# Patient Record
Sex: Male | Born: 1985 | Race: White | Hispanic: No | Marital: Married | Smoking: Former smoker
Health system: Southern US, Community
[De-identification: ages and names within clinical notes are randomized; demographics above are authoritative.]

---
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
## 2018-04-15 ENCOUNTER — Encounter (HOSPITAL_BASED_OUTPATIENT_CLINIC_OR_DEPARTMENT_OTHER): Payer: Self-pay | Admitting: *Deleted

## 2018-04-15 ENCOUNTER — Emergency Department (HOSPITAL_BASED_OUTPATIENT_CLINIC_OR_DEPARTMENT_OTHER): Payer: Worker's Compensation

## 2018-04-15 ENCOUNTER — Emergency Department (HOSPITAL_BASED_OUTPATIENT_CLINIC_OR_DEPARTMENT_OTHER)
Admission: EM | Admit: 2018-04-15 | Discharge: 2018-04-15 | Disposition: A | Discharge status: Home / Self Care | Payer: Worker's Compensation | Attending: Emergency Medicine | Admitting: Emergency Medicine

## 2018-04-15 ENCOUNTER — Other Ambulatory Visit: Payer: Self-pay

## 2018-04-15 DIAGNOSIS — Y9301 Activity, walking, marching and hiking: Secondary | ICD-10-CM | POA: Diagnosis not present

## 2018-04-15 DIAGNOSIS — X509XXA Other and unspecified overexertion or strenuous movements or postures, initial encounter: Secondary | ICD-10-CM | POA: Insufficient documentation

## 2018-04-15 DIAGNOSIS — Y998 Other external cause status: Secondary | ICD-10-CM | POA: Insufficient documentation

## 2018-04-15 DIAGNOSIS — Y9289 Other specified places as the place of occurrence of the external cause: Secondary | ICD-10-CM | POA: Diagnosis not present

## 2018-04-15 DIAGNOSIS — Z87891 Personal history of nicotine dependence: Secondary | ICD-10-CM | POA: Diagnosis not present

## 2018-04-15 DIAGNOSIS — S93401A Sprain of unspecified ligament of right ankle, initial encounter: Secondary | ICD-10-CM | POA: Diagnosis not present

## 2018-04-15 DIAGNOSIS — S99911A Unspecified injury of right ankle, initial encounter: Secondary | ICD-10-CM | POA: Diagnosis present

## 2018-04-15 MED ORDER — OXYCODONE HCL 5 MG PO TABS
5.0000 mg | ORAL_TABLET | Freq: Once | ORAL | Status: AC
  Administered 2018-04-15: 5 mg via ORAL
  Filled 2018-04-15: qty 1

## 2018-04-15 MED ORDER — NAPROXEN 250 MG PO TABS
500.0000 mg | ORAL_TABLET | Freq: Once | ORAL | Status: AC
  Administered 2018-04-15: 500 mg via ORAL
  Filled 2018-04-15: qty 2

## 2018-04-15 NOTE — ED Notes (Signed)
ED Provider at bedside. 

## 2018-04-15 NOTE — Discharge Instructions (Signed)
You were evaluated in the Emergency Department and after careful evaluation, we did not find any emergent condition requiring admission or further testing in the hospital.  Your symptoms today seem to be due to an ankle sprain.  Please use Tylenol and ibuprofen at home for pain, rest the ankle for the next 2 days with slow return to normal daily activities.  Please return to the Emergency Department if you experience any worsening of your condition.  We encourage you to follow up with a primary care provider.  Thank you for allowing us to be a part of your care.

## 2018-04-15 NOTE — ED Provider Notes (Signed)
[facility] Emergency Department Provider Note MRN:  [id number]  Arrival date & time: 04/15/18     Chief Complaint   Ankle Pain   History of Present Illness   Jack Garrett is a 32 y.o. year-old male with no pertinent past medical history presenting to the ED with chief complaint of ankle pain.  Shortly prior to arrival, patient was stepping off of a curb and landed awkwardly on his right ankle.  Reports a forced inversion of the right ankle, followed by immediate moderate to severe pain.  The pain is described as sharp, constant, worse with motion.  Denies other trauma.  Review of Systems  A problem-focused ROS was performed. Positive for ankle pain.  Patient denies other trauma.  Patient's Health History   History reviewed. No pertinent past medical history.  History reviewed. No pertinent surgical history.  History reviewed. No pertinent family history.  Social History   Socioeconomic History  . Marital status: Married    Spouse name: Not on file  . Number of children: Not on file  . Years of education: Not on file  . Highest education level: Not on file  Occupational History  . Not on file  Social Needs  . Financial resource strain: Not on file  . Food insecurity:    Worry: Not on file    Inability: Not on file  . Transportation needs:    Medical: Not on file    Non-medical: Not on file  Tobacco Use  . Smoking status: Former Games developermoker  . Smokeless tobacco: Never Used  Substance and Sexual Activity  . Alcohol use: Not on file  . Drug use: Not on file  . Sexual activity: Not on file  Lifestyle  . Physical activity:    Days per week: Not on file    Minutes per session: Not on file  . Stress: Not on file  Relationships  . Social connections:    Talks on phone: Not on file    Gets together: Not on file    Attends religious service: Not on file    Active member of club or organization: Not on file    Attends meetings of clubs or  organizations: Not on file    Relationship status: Not on file  . Intimate partner violence:    Fear of current or ex partner: Not on file    Emotionally abused: Not on file    Physically abused: Not on file    Forced sexual activity: Not on file  Other Topics Concern  . Not on file  Social History Narrative  . Not on file     Physical Exam  Vital Signs and Nursing Notes reviewed Vitals:   04/15/18 0801  BP: (!) 149/95  Pulse: 64  Resp: 18  Temp: 97.7 F (36.5 C)  SpO2: 100%    CONSTITUTIONAL: Well-appearing, NAD NEURO:  Alert and oriented x 3, no focal deficits EYES:  eyes equal and reactive ENT/NECK:  no LAD, no JVD CARDIO: Regular rate, well-perfused, normal S1 and S2 PULM:  CTAB no wheezing or rhonchi GI/GU:  normal bowel sounds, non-distended, non-tender MSK/SPINE: Swelling and bruising to the right lateral malleolus and right lateral foot SKIN:  no rash PSYCH:  Appropriate speech and behavior  Diagnostic and Interventional Summary    EKG Interpretation  Date/Time:    Ventricular Rate:    PR Interval:    QRS Duration:   QT Interval:    QTC Calculation:  R Axis:     Text Interpretation:        Labs Reviewed - No data to display  DG Ankle Complete Right  Final Result    DG Foot Complete Right  Final Result      Medications  oxyCODONE (Oxy IR/ROXICODONE) immediate release tablet 5 mg (5 mg Oral Given 04/15/18 0810)  naproxen (NAPROSYN) tablet 500 mg (500 mg Oral Given 04/15/18 0810)     Procedures Critical Care  ED Course and Medical Decision Making  I have reviewed the triage vital signs and the nursing notes.  Pertinent labs & imaging results that were available during my care of the patient were reviewed by me and considered in my medical decision making (see below for details).    32 year old male otherwise healthy here with forced inversion of the right ankle stepping off a curb.  Sprain versus fracture, no significant impact to the plantar  surface, neurovascularly intact.  X-rays pending.  X-rays reveal no fracture.  Patient will use crutches for comfort, R ICE for the next 48 hours, advised to follow-up with primary care provider if not significantly improved in 2 weeks.  After the discussed management above, the patient was determined to be safe for discharge.  The patient was in agreement with this plan and all questions regarding their care were answered.  ED return precautions were discussed and the patient will return to the ED with any significant worsening of condition.  Elmer SowMichael M. Pilar PlateBero, MD [facility] [email or web address]  Final Clinical Impressions(s) / ED Diagnoses     ICD-10-CM   1. Sprain of right ankle, unspecified ligament, initial encounter S93.401A     ED Discharge Orders    None         Sabas SousBero, Dsean Vantol M, MD 04/15/18 [phone]

## 2018-04-15 NOTE — ED Notes (Signed)
Lrg ice bag given to Pt for ankle.

## 2018-04-15 NOTE — ED Triage Notes (Signed)
Pt amb to room 2 using crutches. Reports slip and fall yesterday off a curb, turning his right ankle. Denies head injury or loc, denies any other injury or c/o.

## 2019-10-04 IMAGING — CR DG FOOT COMPLETE 3+V*R*
3 series · 3 of 3 positions shown · non-contrast
Comparison: None.

CLINICAL DATA: Pain following rolling type injury

EXAM:
RIGHT FOOT COMPLETE - 3+ VIEW

[t foot lat right]
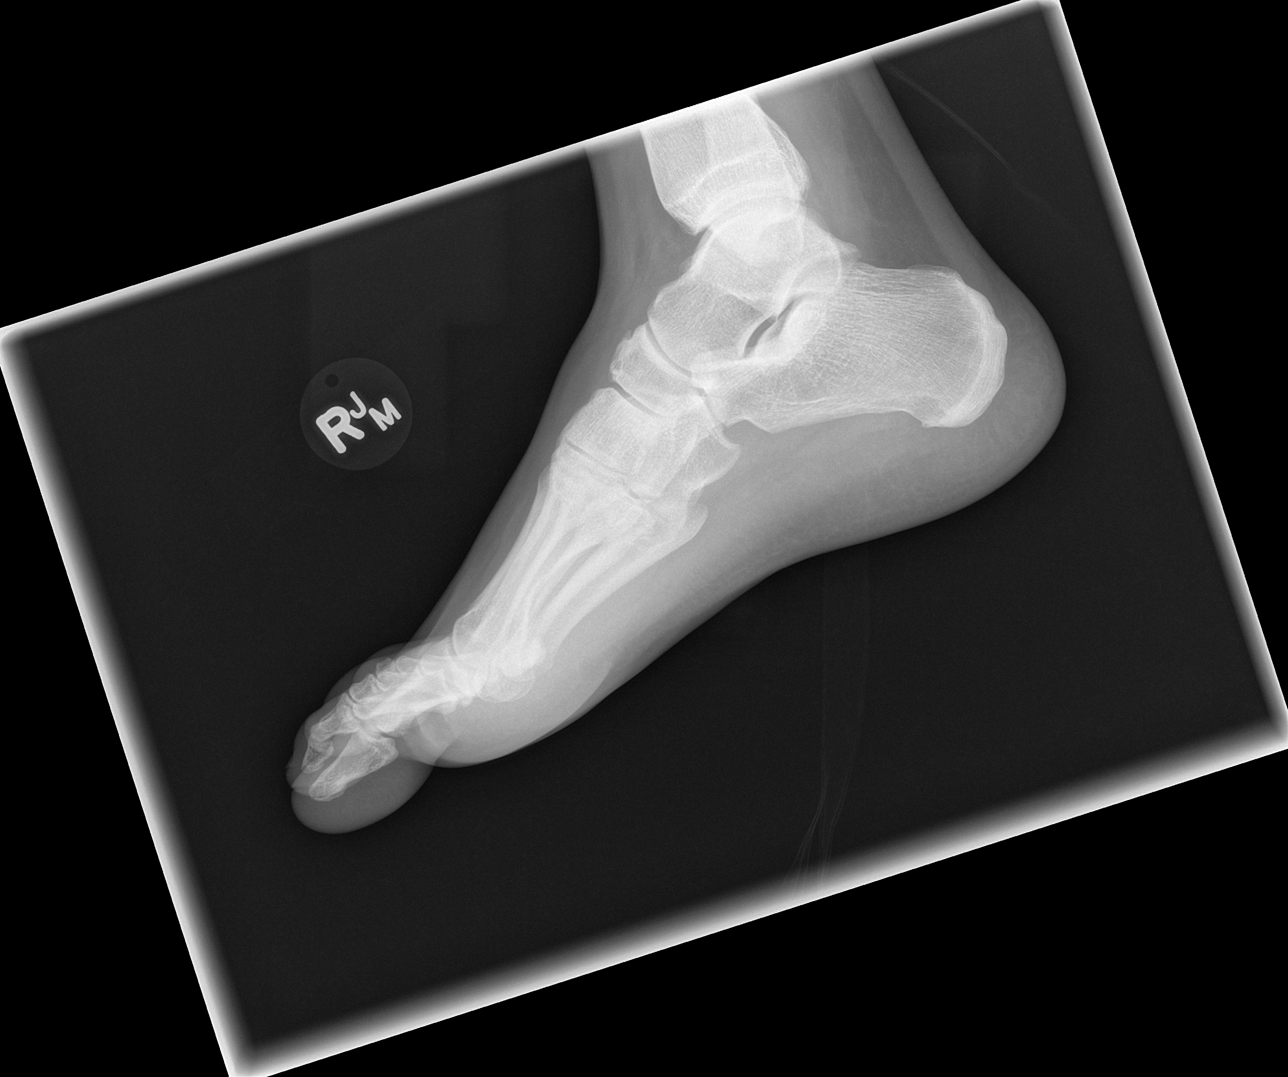

[t foot ap right]
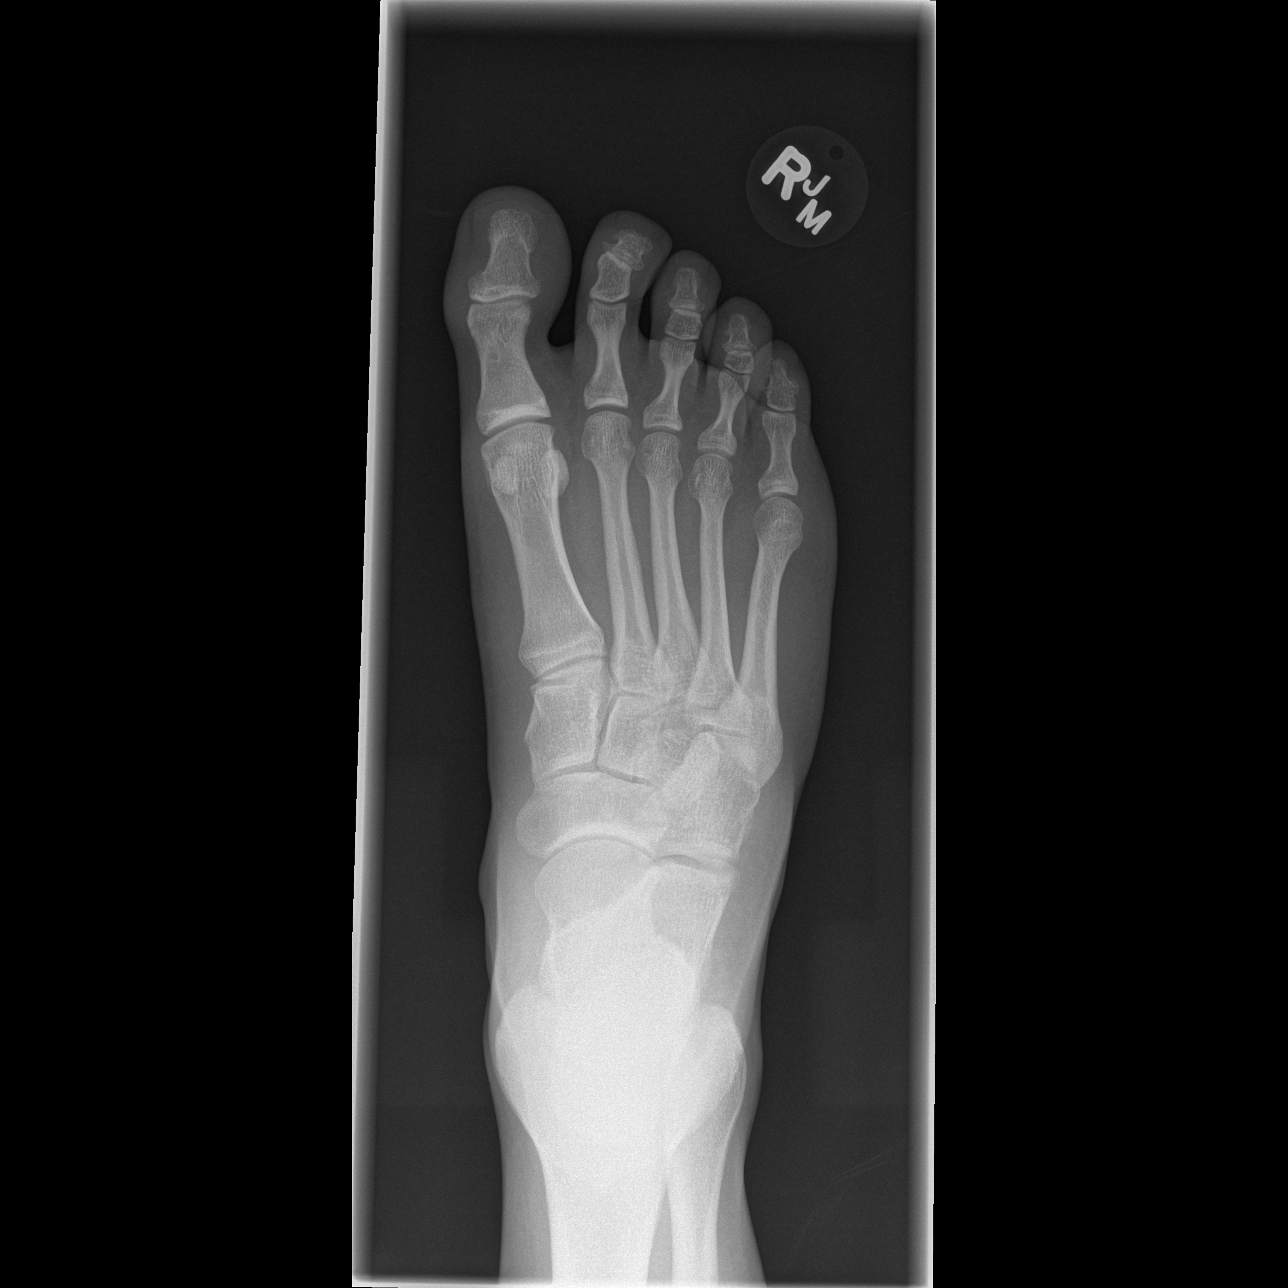

[t foot oblique right]
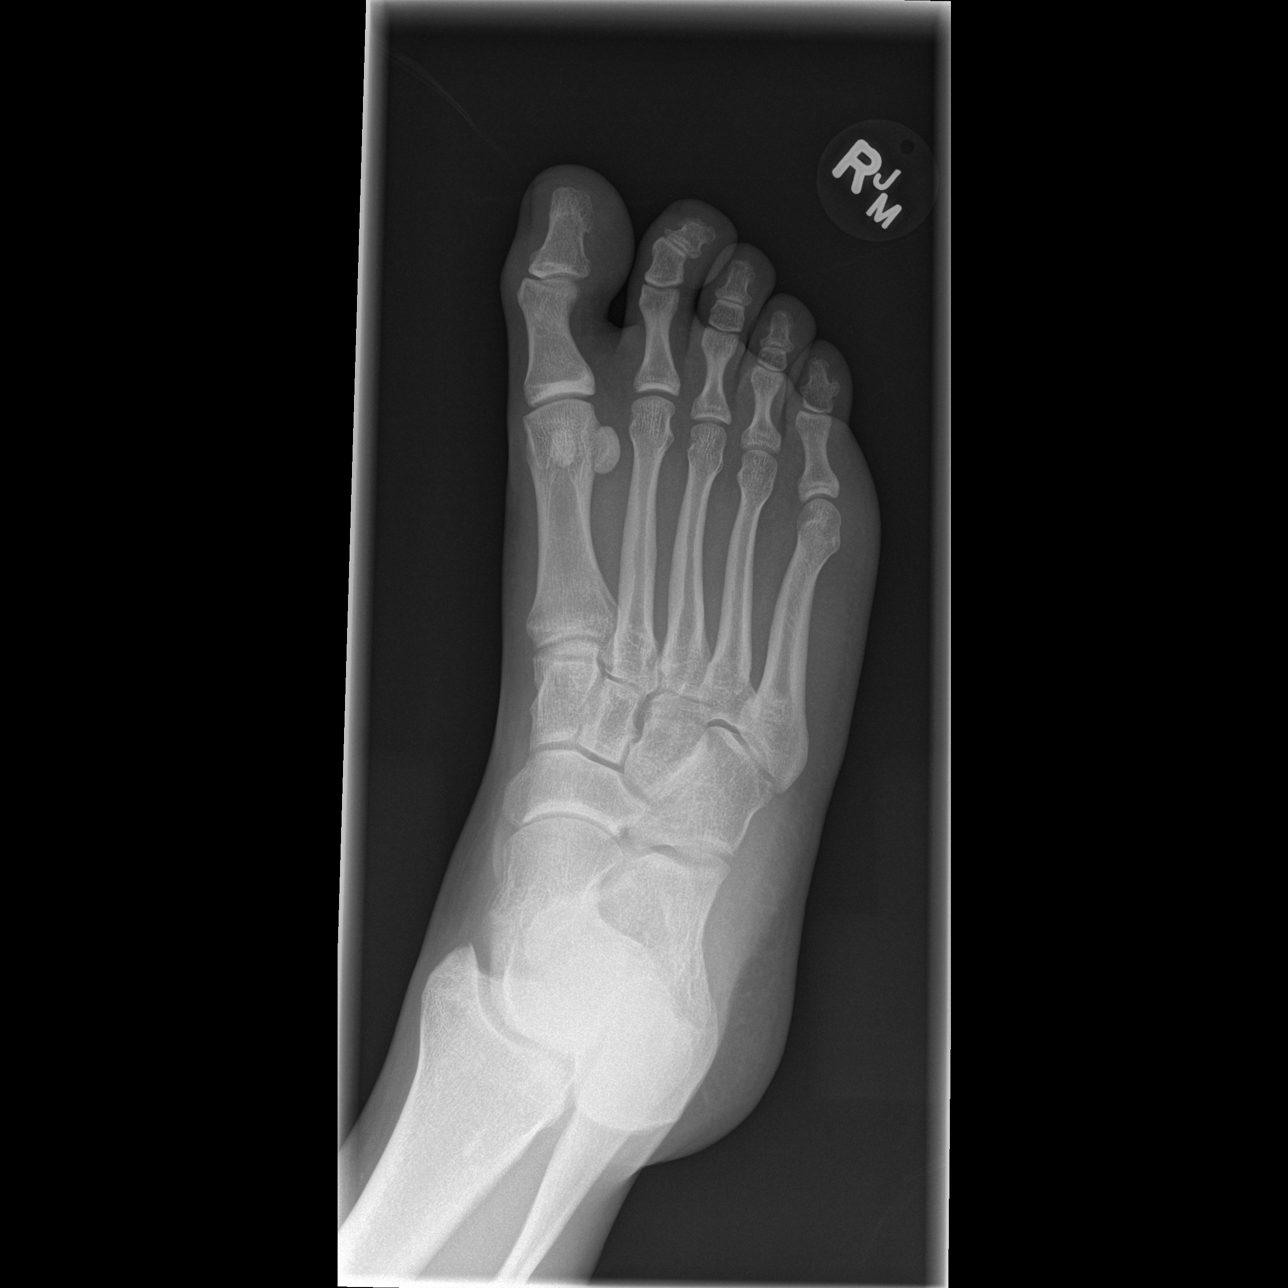

[3 of 3 positions shown; findings below may reference images not displayed]

FINDINGS: Frontal, oblique, and lateral views were obtained. No evident
fracture or dislocation. There is spurring along the dorsal aspect
of the navicular. No appreciable joint space narrowing or erosion.
There is hallux valgus deformity at the second DIP joint.
IMPRESSION: No evident fracture or dislocation. No appreciable joint space
narrowing or erosion. Calix valgus deformity second DIP joint.

## 2020-03-24 ENCOUNTER — Other Ambulatory Visit: Payer: Self-pay

## 2020-03-24 ENCOUNTER — Emergency Department (INDEPENDENT_AMBULATORY_CARE_PROVIDER_SITE_OTHER)
Admission: EM | Admit: 2020-03-24 | Discharge: 2020-03-24 | Disposition: A | Discharge status: Home / Self Care | Payer: BC Managed Care – PPO | Source: Home / Self Care | Attending: Emergency Medicine | Admitting: Emergency Medicine

## 2020-03-24 ENCOUNTER — Ambulatory Visit: Payer: BC Managed Care – PPO

## 2020-03-24 DIAGNOSIS — J029 Acute pharyngitis, unspecified: Secondary | ICD-10-CM | POA: Diagnosis not present

## 2020-03-24 DIAGNOSIS — R509 Fever, unspecified: Secondary | ICD-10-CM

## 2020-03-24 DIAGNOSIS — H65111 Acute and subacute allergic otitis media (mucoid) (sanguinous) (serous), right ear: Secondary | ICD-10-CM

## 2020-03-24 LAB — POCT RAPID STREP A (OFFICE): Rapid Strep A Screen: NEGATIVE

## 2020-03-24 MED ORDER — AMOXICILLIN 875 MG PO TABS
ORAL_TABLET | ORAL | 0 refills | Status: AC
Start: 2020-03-24 — End: ?

## 2020-03-24 NOTE — ED Triage Notes (Addendum)
Pt c/o cold sxs since Wednesday. Started with sore throat, fever developed with chills/bodyaches. RT ear pain started last night. Pt taking dayquil/nyquil prn. Hx of strep as kid, tonsils removed as adult. No known covid exposure. Rapid COVID was neg on Thurs at [facility]. Has not had covid vaccinations.

## 2020-03-24 NOTE — ED Provider Notes (Signed)
[facility]    CSN: [id number] Arrival date & time: 03/24/20  1037      History   Chief Complaint Chief Complaint  Patient presents with  . Appointment    11am  . Sore Throat  . Fever    w/ chills  . Otalgia    RT    HPI Jack Garrett is a 34 y.o. male.   HPI Complains of "cold symptoms", sore throat, fever chills and myalgias for 3 days.. Started with sore throat and fever, then developed chills and body aches.  Then, right ear pain started last night.  Patient taking DayQuil and NyQuil, helping minimally.  History of strep as a child, thad tonsillectomy as an adult. No known Covid exposure. By report, rapid COVID test, negative 2 days ago at [facility]. He has not had Covid vaccinations. History reviewed. No pertinent past medical history.  There are no problems to display for this patient.   History reviewed. No pertinent surgical history.     Home Medications    Prior to Admission medications   Medication Sig Start Date End Date Taking? Authorizing Provider  amoxicillin (AMOXIL) 875 MG tablet Take 1 twice a day X 10 days. 03/24/20   Lajean Manes, MD    Family History History reviewed. No pertinent family history.  Social History Social History   Tobacco Use  . Smoking status: Former Games developer  . Smokeless tobacco: Never Used  Vaping Use  . Vaping Use: Never used  Substance Use Topics  . Alcohol use: Not Currently  . Drug use: Not on file     Allergies   Patient has no known allergies.   Review of Systems Review of Systems Pertinent items noted in HPI and remainder of comprehensive ROS otherwise negative.   Physical Exam Triage Vital Signs ED Triage Vitals  Enc Vitals Group     BP 03/24/20 1051 (!) 142/93     Pulse Rate 03/24/20 1051 99     Resp 03/24/20 1051 17     Temp 03/24/20 1051 99 F (37.2 C)     Temp Source 03/24/20 1051 Oral     SpO2 03/24/20 1051 99 %     Weight --      Height --      Head Circumference --       Peak Flow --      Pain Score 03/24/20 1052 7     Pain Loc --      Pain Edu? --      Excl. in GC? --    No data found.  Updated Vital Signs BP (!) 142/93 (BP Location: Left Arm)   Pulse 99   Temp 99 F (37.2 C) (Oral)   Resp 17   SpO2 99%    Physical Exam Vitals reviewed.  Constitutional:      General: He is not in acute distress.    Appearance: He is well-developed. He is ill-appearing. He is not toxic-appearing or diaphoretic.  HENT:     Head: Normocephalic and atraumatic.     Ears:     Comments: Right TM very red and distorted.  Intact.  Canal normal. Left TM and canal normal.    Nose: Congestion present. No rhinorrhea.     Mouth/Throat:     Mouth: No oral lesions.     Comments: Posterior pharynx diffusely beefy red.  Tonsils surgically absent.  No masses or exudate.  Airway intact. Eyes:     General: No scleral icterus.  Conjunctiva/sclera: Conjunctivae normal.     Pupils: Pupils are equal, round, and reactive to light.  Cardiovascular:     Rate and Rhythm: Normal rate and regular rhythm.     Heart sounds: No murmur heard.   Pulmonary:     Effort: Pulmonary effort is normal.     Breath sounds: Normal breath sounds.  Abdominal:     General: There is no distension.  Musculoskeletal:     Cervical back: Normal range of motion and neck supple.  Lymphadenopathy:     Cervical: Cervical adenopathy present.  Skin:    General: Skin is warm and dry.     Capillary Refill: Capillary refill takes less than 2 seconds.     Findings: No rash.  Neurological:     General: No focal deficit present.     Mental Status: He is alert and oriented to person, place, and time.     Cranial Nerves: No cranial nerve deficit.  Psychiatric:        Behavior: Behavior normal.    Extremities: No cyanosis clubbing or edema or joint swelling or tenderness.  UC Treatments / Results  Labs (all labs ordered are listed, but only abnormal results are displayed) Labs Reviewed  STREP A DNA  PROBE  SARS-COV-2 RNA,(COVID-19) QUALITATIVE NAAT  POCT RAPID STREP A (OFFICE)   Rapid strep test negative.  Radiology No results found.  Procedures Procedures (including critical care time)  Medications Ordered in UC Medications - No data to display  Initial Impression / Assessment and Plan / UC Course  I have reviewed the triage vital signs and the nursing notes.  Pertinent labs & imaging results that were available during my care of the patient were reviewed by me and considered in my medical decision making (see chart for details).      Final Clinical Impressions(s) / UC Diagnoses   Final diagnoses:  Acute pharyngitis, unspecified etiology  Febrile illness, acute  Acute mucoid otitis media of right ear     Discharge Instructions     Even though you do not have tonsils, you have pharyngitis: Your throat is red and infected.  You also have a right ear infection: Red infected right eardrum.  Quick strep test ear was negative, were sending off the more accurate PCR strep test. We are sending off the more accurate PCR COVID test.-Until the Covid test comes back and until it is negative, you need to quarantine. For the pharyngitis and right ear infection, I am prescribing amoxicillin, prescription sent to your pharmacy. Rest, push fluids, Tylenol or ibuprofen if needed for pain or fever. Do not return to work until or unless COVID test negative and fever is better and you are feeling better.-I am writing a separate note for work. Please read instruction sheets on pharyngitis and otitis media. Once you are all better from this illness, I would strongly advise getting COVID vaccination.    Follow-up with your primary care doctor in 5-7 days if not improving, or sooner if symptoms become worse. Precautions discussed. Red flags discussed. Questions invited and answered. Patient voiced understanding and agreement.  ED Prescriptions    Medication Sig Dispense Auth. Provider    amoxicillin (AMOXIL) 875 MG tablet Take 1 twice a day X 10 days. 20 tablet Lajean Manes, MD     PDMP not reviewed this encounter.   Lajean Manes, MD 03/25/20 1254

## 2020-03-24 NOTE — Discharge Instructions (Addendum)
Even though you do not have tonsils, you have pharyngitis: Your throat is red and infected.  You also have a right ear infection: Red infected right eardrum.  Quick strep test ear was negative, were sending off the more accurate PCR strep test. We are sending off the more accurate PCR COVID test.-Until the Covid test comes back and until it is negative, you need to quarantine. For the pharyngitis and right ear infection, I am prescribing amoxicillin, prescription sent to your pharmacy. Rest, push fluids, Tylenol or ibuprofen if needed for pain or fever. Do not return to work until or unless COVID test negative and fever is better and you are feeling better.-I am writing a separate note for work. Please read instruction sheets on pharyngitis and otitis media. Once you are all better from this illness, I would strongly advise getting COVID vaccination.

## 2020-03-26 LAB — SARS-COV-2 RNA,(COVID-19) QUALITATIVE NAAT: SARS CoV2 RNA: NOT DETECTED

## 2020-03-26 LAB — STREP A DNA PROBE: Group A Strep Probe: NOT DETECTED

## 2020-04-02 ENCOUNTER — Telehealth: Payer: Self-pay | Admitting: Emergency Medicine

## 2020-04-02 NOTE — Telephone Encounter (Signed)
Call from pt regarding questions about an allergic reaction- confirmed name/DOB/phone #. Pt drove to work this am and noticed tingling in arms and felt hot around neck - noted hives to his chest and abdomen. Only med taken this am was amoxicillin - started on 03/24/20 x 10 days. Spoke to Waylan Rocher, PA-C - provider here today - pt directed to ED. Pt verbalized an understanding

## 2020-06-05 ENCOUNTER — Emergency Department (INDEPENDENT_AMBULATORY_CARE_PROVIDER_SITE_OTHER)
Admission: RE | Admit: 2020-06-05 | Discharge: 2020-06-05 | Disposition: A | Discharge status: Home / Self Care | Payer: BC Managed Care – PPO | Source: Ambulatory Visit

## 2020-06-05 ENCOUNTER — Other Ambulatory Visit: Payer: Self-pay

## 2020-06-05 VITALS — BP 138/85 | HR 81 | Temp 98.6°F | Resp 20

## 2020-06-05 DIAGNOSIS — B349 Viral infection, unspecified: Secondary | ICD-10-CM

## 2020-06-05 DIAGNOSIS — Z9189 Other specified personal risk factors, not elsewhere classified: Secondary | ICD-10-CM | POA: Diagnosis not present

## 2020-06-05 DIAGNOSIS — R509 Fever, unspecified: Secondary | ICD-10-CM | POA: Diagnosis not present

## 2020-06-05 LAB — POCT INFLUENZA A/B
Influenza A, POC: NEGATIVE
Influenza B, POC: NEGATIVE

## 2020-06-05 LAB — POCT URINALYSIS DIP (MANUAL ENTRY)
Bilirubin, UA: NEGATIVE
Blood, UA: NEGATIVE
Glucose, UA: NEGATIVE mg/dL
Ketones, POC UA: NEGATIVE mg/dL
Leukocytes, UA: NEGATIVE
Nitrite, UA: NEGATIVE
Spec Grav, UA: >=1.03 — AB (ref 1.010–1.025)
Urobilinogen, UA: 0.2 E.U./dL
pH, UA: 6 (ref 5.0–8.0)

## 2020-06-05 NOTE — Discharge Instructions (Addendum)
Recommended continue symptoms management with hydrating well with water and alternating tylenol and ibuprofen for fever. Flu test is negative.  COVID-19 pending.

## 2020-06-05 NOTE — ED Provider Notes (Signed)
[name]    CSN: [id number] Arrival date & time: 06/05/20  0944      History   Chief Complaint Chief Complaint  Patient presents with  . Fever    body aches, chills    HPI Jack Garrett is a 34 y.o. male.    HPI   Patient presents for evaluation of a fever and body aches x 2 days. He denies sick contacts. TMAX 101.5. Fever resolves with ibuprofen. Nothing specifically is causing him distress. No cough, although endorses intermittent congestion type symptoms. Mild nausea today, however able to tolerate fluids. He is also having low back pain which he associates with sleeping on the couch.  History reviewed. No pertinent past medical history.  There are no problems to display for this patient.   History reviewed. No pertinent surgical history.     Home Medications    Prior to Admission medications   Medication Sig Start Date End Date Taking? Authorizing Provider  ibuprofen (ADVIL) 200 MG tablet Take 200 mg by mouth every 6 (six) hours as needed.   Yes [provider]  loratadine (CLARITIN) 10 MG tablet Take 10 mg by mouth daily as needed for allergies.   Yes [provider]  amoxicillin (AMOXIL) 875 MG tablet Take 1 twice a day X 10 days. 03/24/20   Lajean Manes, MD    Family History Family History  Problem Relation Age of Onset  . Cirrhosis Mother     Social History Social History   Tobacco Use  . Smoking status: Former Smoker    Packs/day: 0.50    Years: 8.00    Pack years: 4.00    Types: Cigarettes  . Smokeless tobacco: Never Used  . Tobacco comment: Quit 10 years ago  Vaping Use  . Vaping Use: Never used  Substance Use Topics  . Alcohol use: Yes    Alcohol/week: 1.0 standard drink    Types: 1 Cans of beer per week    Comment: weekly  . Drug use: Never     Allergies   Amoxicillin Review of Systems Review of Systems Pertinent negatives listed in HPI  Physical Exam Triage Vital Signs ED Triage Vitals    Enc Vitals Group     BP 06/05/20 1013 138/85     Pulse Rate 06/05/20 1013 81     Resp 06/05/20 1013 20     Temp 06/05/20 1013 98.6 F (37 C)     Temp Source 06/05/20 1013 Oral     SpO2 06/05/20 1013 97 %     Weight --      Height --      Head Circumference --      Peak Flow --      Pain Score 06/05/20 1006 5     Pain Loc --      Pain Edu? --      Excl. in GC? --    No data found.  Updated Vital Signs BP 138/85 (BP Location: Right Arm)   Pulse 81   Temp 98.6 F (37 C) (Oral)   Resp 20   SpO2 97%   Visual Acuity Right Eye Distance:   Left Eye Distance:   Bilateral Distance:    Right Eye Near:   Left Eye Near:    Bilateral Near:     Physical Exam Constitutional:      Appearance: He is ill-appearing.  HENT:     Head: Normocephalic.     Nose: Nose normal.  Mouth/Throat:     Mouth: Mucous membranes are moist.  Cardiovascular:     Rate and Rhythm: Normal rate and regular rhythm.  Pulmonary:     Effort: Pulmonary effort is normal.     Breath sounds: Normal breath sounds.  Musculoskeletal:     Cervical back: Normal range of motion and neck supple.  Skin:    General: Skin is warm.  Neurological:     General: No focal deficit present.     Mental Status: He is alert.      UC Treatments / Results  Labs (all labs ordered are listed, but only abnormal results are displayed) Labs Reviewed  SARS-COV-2 RNA,(COVID-19) QUALITATIVE NAAT    EKG   Radiology No results found.  Procedures Procedures (including critical care time)  Medications Ordered in UC Medications - No data to display  Initial Impression / Assessment and Plan / UC Course  I have reviewed the triage vital signs and the nursing notes.  Pertinent labs & imaging results that were available during my care of the patient were reviewed by me and considered in my medical decision making (see chart for details).    COVID-19 test pending. Influenza test negative. UA negative. Recommended  continue symptoms management with hydrating well with water and alternating tylenol and ibuprofen for fever. If fever worsens or any other new symptoms develop follow-up immediately.  Be notified of your COVID-19 test results with an 2 to 3 days.   Final Clinical Impressions(s) / UC Diagnoses   Final diagnoses:  At increased risk of exposure to COVID-19 virus  Febrile illness  Viral illness     Discharge Instructions     Recommended continue symptoms management with hydrating well with water and alternating tylenol and ibuprofen for fever. Flu test is negative.  COVID-19 pending.    ED Prescriptions    None     PDMP not reviewed this encounter.   Bing Neighbors, FNP 06/05/20 1919

## 2020-06-05 NOTE — ED Triage Notes (Signed)
Pt presents to Urgent Care with c/o fever of 101.2 this AM, chills, and body aches. Pt states symptoms began last night. States he also has episodes of dizziness. Reports minimal cough. He took ibuprofen @ 0700 this AM.  Pt w/ no known COVID exposure. Pt has not been vaccinated against COVID.

## 2020-06-06 LAB — SARS-COV-2 RNA,(COVID-19) QUALITATIVE NAAT: SARS CoV2 RNA: DETECTED — CR
